# Patient Record
Sex: Male | Born: 1960 | Race: Black or African American | Hispanic: No | Smoking: Never smoker
Health system: Southern US, Community
[De-identification: ages and names within clinical notes are randomized; demographics above are authoritative.]

## PROBLEM LIST (undated history)

## (undated) DIAGNOSIS — I10 Essential (primary) hypertension: Secondary | ICD-10-CM

## (undated) HISTORY — PX: ABDOMINAL SURGERY: SHX537

## (undated) HISTORY — PX: COLOSTOMY TAKEDOWN: SHX5783

---

## 2000-01-31 ENCOUNTER — Inpatient Hospital Stay (HOSPITAL_COMMUNITY): Admission: EM | Admit: 2000-01-31 | Discharge: 2000-02-09 | Payer: Self-pay | Admitting: Emergency Medicine

## 2000-01-31 ENCOUNTER — Encounter: Payer: Self-pay | Admitting: Emergency Medicine

## 2000-05-07 ENCOUNTER — Inpatient Hospital Stay (HOSPITAL_COMMUNITY): Admission: RE | Admit: 2000-05-07 | Discharge: 2000-05-13 | Payer: Self-pay | Admitting: Surgery

## 2000-05-21 ENCOUNTER — Inpatient Hospital Stay (HOSPITAL_COMMUNITY): Admission: AD | Admit: 2000-05-21 | Discharge: 2000-05-27 | Payer: Self-pay | Admitting: Surgery

## 2000-05-21 ENCOUNTER — Encounter: Payer: Self-pay | Admitting: Surgery

## 2000-05-22 ENCOUNTER — Encounter: Payer: Self-pay | Admitting: Surgery

## 2000-05-24 ENCOUNTER — Encounter: Payer: Self-pay | Admitting: Surgery

## 2000-08-27 ENCOUNTER — Inpatient Hospital Stay (HOSPITAL_COMMUNITY): Admission: EM | Admit: 2000-08-27 | Discharge: 2000-08-29 | Payer: Self-pay | Admitting: Surgery

## 2001-01-02 ENCOUNTER — Ambulatory Visit (HOSPITAL_COMMUNITY): Admission: RE | Admit: 2001-01-02 | Discharge: 2001-01-02 | Payer: Self-pay | Admitting: Surgery

## 2001-01-02 ENCOUNTER — Encounter: Payer: Self-pay | Admitting: Surgery

## 2014-07-20 ENCOUNTER — Encounter (HOSPITAL_COMMUNITY): Payer: Self-pay | Admitting: Emergency Medicine

## 2014-07-20 ENCOUNTER — Emergency Department (HOSPITAL_COMMUNITY)
Admission: EM | Admit: 2014-07-20 | Discharge: 2014-07-20 | Disposition: A | Discharge status: Home / Self Care | Payer: Medicaid - Out of State | Attending: Emergency Medicine | Admitting: Emergency Medicine

## 2014-07-20 ENCOUNTER — Emergency Department (HOSPITAL_COMMUNITY): Payer: Medicaid - Out of State

## 2014-07-20 DIAGNOSIS — Y998 Other external cause status: Secondary | ICD-10-CM | POA: Insufficient documentation

## 2014-07-20 DIAGNOSIS — Y9389 Activity, other specified: Secondary | ICD-10-CM | POA: Insufficient documentation

## 2014-07-20 DIAGNOSIS — S20212A Contusion of left front wall of thorax, initial encounter: Secondary | ICD-10-CM

## 2014-07-20 DIAGNOSIS — Y92410 Unspecified street and highway as the place of occurrence of the external cause: Secondary | ICD-10-CM | POA: Diagnosis not present

## 2014-07-20 DIAGNOSIS — S299XXA Unspecified injury of thorax, initial encounter: Secondary | ICD-10-CM | POA: Diagnosis present

## 2014-07-20 MED ORDER — HYDROCODONE-ACETAMINOPHEN 5-325 MG PO TABS: ORAL_TABLET | ORAL | Status: DC

## 2014-07-20 MED ORDER — HYDROCODONE-ACETAMINOPHEN 5-325 MG PO TABS
1.0000 | ORAL_TABLET | Freq: Once | ORAL | Status: AC
  Administered 2014-07-20: 1 via ORAL
  Filled 2014-07-20: qty 1
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
## 2014-07-20 NOTE — Discharge Instructions (Signed)
For pain control please take ibuprofen (also known as Motrin or Advil) 800mg  (this is normally 4 over the counter pills) 3 times a day  for 5 days. Take with food to minimize stomach irritation.  Take vicodin for breakthrough pain, do not drink alcohol, drive, care for children or do other critical tasks while taking vicodin.  It is very important that you take deep breaths to prevent lung collapse and infection.  Take 10 deep breaths every hour to prevent lung collapse.  If you develop cough, fever or shortness of breath return immediately to the emergency room.   Chest Contusion A chest contusion is a deep bruise on your chest area. Contusions are the result of an injury that caused bleeding under the skin. A chest contusion may involve bruising of the skin, muscles, or ribs. The contusion may turn blue, purple, or yellow. Minor injuries will give you a painless contusion, but more severe contusions may stay painful and swollen for a few weeks. CAUSES  A contusion is usually caused by a blow, trauma, or direct force to an area of the body. SYMPTOMS   Swelling and redness of the injured area.  Discoloration of the injured area.  Tenderness and soreness of the injured area.  Pain. DIAGNOSIS  The diagnosis can be made by taking a history and performing a physical exam. An X-ray, CT scan, or MRI may be needed to determine if there were any associated injuries, such as broken bones (fractures) or internal injuries. TREATMENT  Often, the best treatment for a chest contusion is resting, icing, and applying cold compresses to the injured area. Deep breathing exercises may be recommended to reduce the risk of pneumonia. Over-the-counter medicines may also be recommended for pain control. HOME CARE INSTRUCTIONS   Put ice on the injured area.  Put ice in a plastic bag.  Place a towel between your skin and the bag.  Leave the ice on for 15-20 minutes, 03-04 times a day.  Only take  over-the-counter or prescription medicines as directed by your caregiver. Your caregiver may recommend avoiding anti-inflammatory medicines (aspirin, ibuprofen, and naproxen) for 48 hours because these medicines may increase bruising.  Rest the injured area.  Perform deep-breathing exercises as directed by your caregiver.  Stop smoking if you smoke.  Do not lift objects over 5 pounds (2.3 kg) for 3 days or longer if recommended by your caregiver. SEEK IMMEDIATE MEDICAL CARE IF:   You have increased bruising or swelling.  You have pain that is getting worse.  You have difficulty breathing.  You have dizziness, weakness, or fainting.  You have blood in your urine or stool.  You cough up or vomit blood.  Your swelling or pain is not relieved with medicines. MAKE SURE YOU:   Understand these instructions.  Will watch your condition.  Will get help right away if you are not doing well or get worse. Document Released: 03/27/2001 Document Revised: 03/26/2012 Document Reviewed: 12/24/2011 [facility] Patient Information 2015 [place], [place]. This information is not intended to replace advice given to you by your health care provider. Make sure you discuss any questions you have with your health care provider.
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
## 2015-04-04 ENCOUNTER — Emergency Department (HOSPITAL_COMMUNITY)
Admission: EM | Admit: 2015-04-04 | Discharge: 2015-04-04 | Disposition: A | Discharge status: Home / Self Care | Payer: 59 | Attending: Emergency Medicine | Admitting: Emergency Medicine

## 2015-04-04 ENCOUNTER — Encounter (HOSPITAL_COMMUNITY): Payer: Self-pay | Admitting: Oncology

## 2015-04-04 DIAGNOSIS — Z79899 Other long term (current) drug therapy: Secondary | ICD-10-CM | POA: Insufficient documentation

## 2015-04-04 DIAGNOSIS — I10 Essential (primary) hypertension: Secondary | ICD-10-CM | POA: Diagnosis not present

## 2015-04-04 DIAGNOSIS — M5412 Radiculopathy, cervical region: Secondary | ICD-10-CM | POA: Diagnosis not present

## 2015-04-04 DIAGNOSIS — R200 Anesthesia of skin: Secondary | ICD-10-CM | POA: Diagnosis present

## 2015-04-04 HISTORY — DX: Essential (primary) hypertension: I10

## 2015-04-04 MED ORDER — PREDNISONE 10 MG PO TABS
ORAL_TABLET | ORAL | Status: DC
Start: 2015-04-04 — End: 2017-11-07

## 2015-04-04 NOTE — ED Notes (Signed)
Pt checked in and then proceeded to the restroom.
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
## 2015-04-04 NOTE — Discharge Instructions (Signed)
FOLLOW UP WITH EITHER DR. CABBELL OR DR. CHANDLER FOR FURTHER OUTPATIENT EVALUATION AND MANAGEMENT OF LEFT ARM TINGLING AND WEAKNESS.   Cervical Radiculopathy Cervical radiculopathy happens when a nerve in the neck is pinched or bruised by a slipped (herniated) disk or by arthritic changes in the bones of the cervical spine. This can occur due to an injury or as part of the normal aging process. Pressure on the cervical nerves can cause pain or numbness that runs from your neck all the way down into your arm and fingers. CAUSES  There are many possible causes, including:  Injury.  Muscle tightness in the neck from overuse.  Swollen, painful joints (arthritis).  Breakdown or degeneration in the bones and joints of the spine (spondylosis) due to aging.  Bone spurs that may develop near the cervical nerves. SYMPTOMS  Symptoms include pain, weakness, or numbness in the affected arm and hand. Pain can be severe or irritating. Symptoms may be worse when extending or turning the neck. DIAGNOSIS  Your caregiver will ask about your symptoms and do a physical exam. He or she may test your strength and reflexes. X-rays, CT scans, and MRI scans may be needed in cases of injury or if the symptoms do not go away after a period of time. Electromyography (EMG) or nerve conduction testing may be done to study how your nerves and muscles are working. TREATMENT  Your caregiver may recommend certain exercises to help relieve your symptoms. Cervical radiculopathy can, and often does, get better with time and treatment. If your problems continue, treatment options may include:  Wearing a soft collar for short periods of time.  Physical therapy to strengthen the neck muscles.  Medicines, such as nonsteroidal anti-inflammatory drugs (NSAIDs), oral corticosteroids, or spinal injections.  Surgery. Different types of surgery may be done depending on the cause of your problems. HOME CARE INSTRUCTIONS   Put ice  on the affected area.  Put ice in a plastic bag.  Place a towel between your skin and the bag.  Leave the ice on for 15-20 minutes, 03-04 times a day or as directed by your caregiver.  If ice does not help, you can try using heat. Take a warm shower or bath, or use a hot water bottle as directed by your caregiver.  You may try a gentle neck and shoulder massage.  Use a flat pillow when you sleep.  Only take over-the-counter or prescription medicines for pain, discomfort, or fever as directed by your caregiver.  If physical therapy was prescribed, follow your caregiver's directions.  If a soft collar was prescribed, use it as directed. SEEK IMMEDIATE MEDICAL CARE IF:   Your pain gets much worse and cannot be controlled with medicines.  You have weakness or numbness in your hand, arm, face, or leg.  You have a high fever or a stiff, rigid neck.  You lose bowel or bladder control (incontinence).  You have trouble with walking, balance, or speaking. MAKE SURE YOU:   Understand these instructions.  Will watch your condition.  Will get help right away if you are not doing well or get worse. Document Released: 03/27/2001 Document Revised: 09/24/2011 Document Reviewed: 02/13/2011 [facility] Patient Information 2015 [place], [place]. This information is not intended to replace advice given to you by your health care provider. Make sure you discuss any questions you have with your health care provider.
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
## 2017-11-07 ENCOUNTER — Emergency Department (HOSPITAL_BASED_OUTPATIENT_CLINIC_OR_DEPARTMENT_OTHER): Payer: Medicaid - Out of State

## 2017-11-07 ENCOUNTER — Emergency Department (HOSPITAL_BASED_OUTPATIENT_CLINIC_OR_DEPARTMENT_OTHER)
Admission: EM | Admit: 2017-11-07 | Discharge: 2017-11-08 | Disposition: A | Discharge status: Home / Self Care | Payer: Medicaid - Out of State | Attending: Emergency Medicine | Admitting: Emergency Medicine

## 2017-11-07 ENCOUNTER — Other Ambulatory Visit: Payer: Self-pay

## 2017-11-07 ENCOUNTER — Encounter (HOSPITAL_BASED_OUTPATIENT_CLINIC_OR_DEPARTMENT_OTHER): Payer: Self-pay | Admitting: *Deleted

## 2017-11-07 DIAGNOSIS — I10 Essential (primary) hypertension: Secondary | ICD-10-CM | POA: Insufficient documentation

## 2017-11-07 DIAGNOSIS — R112 Nausea with vomiting, unspecified: Secondary | ICD-10-CM | POA: Insufficient documentation

## 2017-11-07 DIAGNOSIS — R7401 Elevation of levels of liver transaminase levels: Secondary | ICD-10-CM

## 2017-11-07 DIAGNOSIS — Z79899 Other long term (current) drug therapy: Secondary | ICD-10-CM | POA: Insufficient documentation

## 2017-11-07 DIAGNOSIS — R740 Nonspecific elevation of levels of transaminase and lactic acid dehydrogenase [LDH]: Secondary | ICD-10-CM | POA: Insufficient documentation

## 2017-11-07 DIAGNOSIS — R066 Hiccough: Secondary | ICD-10-CM | POA: Insufficient documentation

## 2017-11-07 DIAGNOSIS — R197 Diarrhea, unspecified: Secondary | ICD-10-CM | POA: Insufficient documentation
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
## 2017-11-07 NOTE — ED Notes (Signed)
Patient transported to X-ray 

## 2017-11-07 NOTE — ED Triage Notes (Addendum)
[place]  C/o FLu like symptoms and uncontrolled hiccups for 3 days

## 2017-11-08 LAB — COMPREHENSIVE METABOLIC PANEL
ALBUMIN: 3.5 g/dL (ref 3.5–5.0)
ALT: 383 U/L — ABNORMAL HIGH (ref 17–63)
AST: 148 U/L — AB (ref 15–41)
Alkaline Phosphatase: 54 U/L (ref 38–126)
Anion gap: 16 — ABNORMAL HIGH (ref 5–15)
BUN: 21 mg/dL — AB (ref 6–20)
CHLORIDE: 95 mmol/L — AB (ref 101–111)
CO2: 21 mmol/L — ABNORMAL LOW (ref 22–32)
Calcium: 8.7 mg/dL — ABNORMAL LOW (ref 8.9–10.3)
Creatinine, Ser: 1.6 mg/dL — ABNORMAL HIGH (ref 0.61–1.24)
GFR calc Af Amer: 54 mL/min — ABNORMAL LOW (ref >60)
GFR calc non Af Amer: 47 mL/min — ABNORMAL LOW (ref >60)
GLUCOSE: 108 mg/dL — AB (ref 65–99)
Potassium: 3.7 mmol/L (ref 3.5–5.1)
Sodium: 132 mmol/L — ABNORMAL LOW (ref 135–145)
Total Bilirubin: 1.4 mg/dL — ABNORMAL HIGH (ref 0.3–1.2)
Total Protein: 5.9 g/dL — ABNORMAL LOW (ref 6.5–8.1)

## 2017-11-08 LAB — CBC WITH DIFFERENTIAL/PLATELET
Basophils Absolute: 0 10*3/uL (ref 0.0–0.1)
Basophils Relative: 1 %
EOS ABS: 0.5 10*3/uL (ref 0.0–0.7)
Eosinophils Relative: 6 %
HEMATOCRIT: 36.2 % — AB (ref 39.0–52.0)
Hemoglobin: 13.1 g/dL (ref 13.0–17.0)
LYMPHS PCT: 15 %
Lymphs Abs: 1.2 10*3/uL (ref 0.7–4.0)
MCH: 30.3 pg (ref 26.0–34.0)
MCHC: 36.2 g/dL — ABNORMAL HIGH (ref 30.0–36.0)
MCV: 83.6 fL (ref 78.0–100.0)
Monocytes Absolute: 0.7 10*3/uL (ref 0.1–1.0)
Monocytes Relative: 9 %
NEUTROS PCT: 69 %
Neutro Abs: 5.7 10*3/uL (ref 1.7–7.7)
PLATELETS: 144 10*3/uL — AB (ref 150–400)
RBC: 4.33 MIL/uL (ref 4.22–5.81)
RDW: 12.6 % (ref 11.5–15.5)
WBC: 8.1 10*3/uL (ref 4.0–10.5)

## 2017-11-08 MED ORDER — METOCLOPRAMIDE HCL 10 MG PO TABS: 10.0000 mg | ORAL_TABLET | Freq: Four times a day (QID) | ORAL | 0 refills | Status: AC | PRN

## 2017-11-08 MED ORDER — SODIUM CHLORIDE 0.9 % IV BOLUS
1000.0000 mL | Freq: Once | INTRAVENOUS | Status: AC
  Administered 2017-11-08: 1000 mL via INTRAVENOUS

## 2017-11-08 MED ORDER — METOCLOPRAMIDE HCL 5 MG/ML IJ SOLN: 10.0000 mg | Freq: Once | INTRAMUSCULAR | Status: DC

## 2017-11-08 MED ORDER — METOCLOPRAMIDE HCL 5 MG/ML IJ SOLN
10.0000 mg | Freq: Once | INTRAMUSCULAR | Status: AC
  Administered 2017-11-08: 10 mg via INTRAVENOUS
  Filled 2017-11-08: qty 2

## 2017-11-08 NOTE — Discharge Instructions (Signed)
Your lab work showed abnormal liver function tests.  The cause of this is unclear at this time and will require further evaluation by your primary care physician.  Please make an appointment for follow-up.

## 2017-11-08 NOTE — ED Notes (Signed)
Pt verbalizes understanding of d/c instructions and denies any further needs at this time.
